# Patient Record
Sex: Male | Born: 1985 | Race: Black or African American | Hispanic: No | Marital: Married | Smoking: Current some day smoker
Health system: Southern US, Community
[De-identification: ages and names within clinical notes are randomized; demographics above are authoritative.]

## PROBLEM LIST (undated history)

## (undated) DIAGNOSIS — M419 Scoliosis, unspecified: Secondary | ICD-10-CM

---

## 2005-01-14 ENCOUNTER — Emergency Department (HOSPITAL_COMMUNITY): Admission: EM | Admit: 2005-01-14 | Discharge: 2005-01-14 | Payer: Self-pay | Admitting: Emergency Medicine

## 2009-08-31 ENCOUNTER — Ambulatory Visit: Payer: Self-pay | Admitting: Family Medicine

## 2010-11-26 ENCOUNTER — Encounter: Payer: Self-pay | Admitting: Family Medicine

## 2010-12-05 ENCOUNTER — Ambulatory Visit (INDEPENDENT_AMBULATORY_CARE_PROVIDER_SITE_OTHER): Payer: BC Managed Care – PPO | Admitting: Family Medicine

## 2010-12-05 ENCOUNTER — Encounter: Payer: Self-pay | Admitting: Family Medicine

## 2010-12-05 VITALS — BP 110/66 | HR 50 | Wt 140.0 lb

## 2010-12-05 DIAGNOSIS — L819 Disorder of pigmentation, unspecified: Secondary | ICD-10-CM

## 2010-12-05 DIAGNOSIS — B070 Plantar wart: Secondary | ICD-10-CM

## 2010-12-05 DIAGNOSIS — L818 Other specified disorders of pigmentation: Secondary | ICD-10-CM

## 2010-12-05 DIAGNOSIS — Z209 Contact with and (suspected) exposure to unspecified communicable disease: Secondary | ICD-10-CM

## 2010-12-05 DIAGNOSIS — Z0000 Encounter for general adult medical examination without abnormal findings: Secondary | ICD-10-CM

## 2010-12-05 LAB — POCT URINALYSIS DIPSTICK
Bilirubin, UA: NEGATIVE
Blood, UA: NEGATIVE
Ketones, UA: NEGATIVE
Leukocytes, UA: NEGATIVE
pH, UA: 6

## 2010-12-05 NOTE — Patient Instructions (Signed)
Use Compound W daily for 4 days and then scrape the area. Start over on a weekly basis.

## 2010-12-05 NOTE — Progress Notes (Signed)
  Subjective:    Patient ID: Eduardo Fields, male    DOB: 05/07/1986, 25 y.o.   MRN: [id number]  HPI he is here for a complete examination. He is now going to [employer] and wants to eventually go to PT school. He is dating a young lady and has been doing this for 11 years. They seem to be getting along fairly well although he does have concerns over her ultimate goals. He would like to be tested for STDs. He also needs a PPD for school. He did have a tattoo placed last year. He has no other concerns or complaints.    Review of Systems    negative Objective:   Physical ExamBP 110/66  Pulse 50  Wt 140 lb (63.504 kg)  General Appearance:    Alert, cooperative, no distress, appears stated age  Head:    Normocephalic, without obvious abnormality, atraumatic  Eyes:    PERRL, conjunctiva/corneas clear, EOM's intact, fundi    benign  Ears:    Normal TM's and external ear canals  Nose:   Nares normal, mucosa normal, no drainage or sinus   tenderness  Throat:   Lips, mucosa, and tongue normal; teeth and gums normal  Neck:   Supple, no lymphadenopathy;  thyroid:  no   enlargement/tenderness/nodules; no carotid   bruit or JVD  Back:    Spine nontender, no curvature, ROM normal, no CVA     tenderness  Lungs:     Clear to auscultation bilaterally without wheezes, rales or     ronchi; respirations unlabored  Chest Wall:    No tenderness or deformity   Heart:    Regular rate and rhythm, S1 and S2 normal, no murmur, rub   or gallop  Breast Exam:    No chest wall tenderness, masses or gynecomastia  Abdomen:     Soft, non-tender, nondistended, normoactive bowel sounds,    no masses, no hepatosplenomegaly  Genitalia:    Normal male external genitalia without lesions.  Testicles without masses.  No inguinal hernias.  Rectal:   Deferred due to age <40 and lack of symptoms  Extremities:   No clubbing, cyanosis or edema  Pulses:   2+ and symmetric all extremities  Skin:   Skin color, texture, turgor normal,  no rashes or lesions.tattoos present on anterior chest .plantar wart present at the base of the left fifth toe near the MTP joint   Lymph nodes:   Cervical, supraclavicular, and axillary nodes normal  Neurologic:   CNII-XII intact, normal strength, sensation and gait; reflexes 2+ and symmetric throughout          Psych:   Normal mood, affect, hygiene and grooming.            Assessment & Plan:  Tattoos. Plantar wart.

## 2010-12-06 ENCOUNTER — Telehealth: Payer: Self-pay

## 2010-12-06 LAB — HIV ANTIBODY (ROUTINE TESTING W REFLEX): HIV: NONREACTIVE

## 2010-12-06 LAB — RPR

## 2010-12-06 NOTE — Telephone Encounter (Signed)
Pt has been informed of lab results cmr

## 2010-12-06 NOTE — Telephone Encounter (Signed)
Left mess. To call me back labs are normal.cmr

## 2019-09-24 ENCOUNTER — Emergency Department (HOSPITAL_COMMUNITY): Payer: Self-pay

## 2019-09-24 ENCOUNTER — Emergency Department (HOSPITAL_COMMUNITY)
Admission: EM | Admit: 2019-09-24 | Discharge: 2019-09-24 | Disposition: A | Discharge status: Home / Self Care | Payer: Self-pay | Attending: Emergency Medicine | Admitting: Emergency Medicine

## 2019-09-24 ENCOUNTER — Other Ambulatory Visit: Payer: Self-pay

## 2019-09-24 ENCOUNTER — Encounter (HOSPITAL_COMMUNITY): Payer: Self-pay | Admitting: Emergency Medicine

## 2019-09-24 DIAGNOSIS — R112 Nausea with vomiting, unspecified: Secondary | ICD-10-CM | POA: Insufficient documentation

## 2019-09-24 DIAGNOSIS — N50812 Left testicular pain: Secondary | ICD-10-CM | POA: Insufficient documentation

## 2019-09-24 DIAGNOSIS — F17210 Nicotine dependence, cigarettes, uncomplicated: Secondary | ICD-10-CM | POA: Insufficient documentation

## 2019-09-24 LAB — COMPREHENSIVE METABOLIC PANEL
ALT: 14 U/L (ref 0–44)
AST: 18 U/L (ref 15–41)
Albumin: 4.1 g/dL (ref 3.5–5.0)
Alkaline Phosphatase: 52 U/L (ref 38–126)
Anion gap: 9 (ref 5–15)
BUN: 11 mg/dL (ref 6–20)
CO2: 27 mmol/L (ref 22–32)
Calcium: 9.3 mg/dL (ref 8.9–10.3)
Chloride: 103 mmol/L (ref 98–111)
Creatinine, Ser: 1.35 mg/dL — ABNORMAL HIGH (ref 0.61–1.24)
GFR calc Af Amer: >60 mL/min (ref >60)
GFR calc non Af Amer: >60 mL/min (ref >60)
Glucose, Bld: 105 mg/dL — ABNORMAL HIGH (ref 70–99)
Potassium: 3.9 mmol/L (ref 3.5–5.1)
Sodium: 139 mmol/L (ref 135–145)
Total Bilirubin: 0.3 mg/dL (ref 0.3–1.2)
Total Protein: 6.8 g/dL (ref 6.5–8.1)

## 2019-09-24 LAB — URINALYSIS, ROUTINE W REFLEX MICROSCOPIC
Bilirubin Urine: NEGATIVE
Glucose, UA: NEGATIVE mg/dL
Hgb urine dipstick: NEGATIVE
Ketones, ur: 20 mg/dL — AB
Leukocytes,Ua: NEGATIVE
Nitrite: NEGATIVE
Protein, ur: NEGATIVE mg/dL
Specific Gravity, Urine: 1.03 (ref 1.005–1.030)
pH: 5 (ref 5.0–8.0)

## 2019-09-24 LAB — CBC WITH DIFFERENTIAL/PLATELET
Abs Immature Granulocytes: 0.02 10*3/uL (ref 0.00–0.07)
Basophils Absolute: 0 10*3/uL (ref 0.0–0.1)
Basophils Relative: 0 %
Eosinophils Absolute: 0.1 10*3/uL (ref 0.0–0.5)
Eosinophils Relative: 2 %
HCT: 48.3 % (ref 39.0–52.0)
Hemoglobin: 16.1 g/dL (ref 13.0–17.0)
Immature Granulocytes: 0 %
Lymphocytes Relative: 30 %
Lymphs Abs: 2 10*3/uL (ref 0.7–4.0)
MCH: 32.5 pg (ref 26.0–34.0)
MCHC: 33.3 g/dL (ref 30.0–36.0)
MCV: 97.4 fL (ref 80.0–100.0)
Monocytes Absolute: 0.4 10*3/uL (ref 0.1–1.0)
Monocytes Relative: 6 %
Neutro Abs: 4.2 10*3/uL (ref 1.7–7.7)
Neutrophils Relative %: 62 %
Platelets: 275 10*3/uL (ref 150–400)
RBC: 4.96 MIL/uL (ref 4.22–5.81)
RDW: 11.8 % (ref 11.5–15.5)
WBC: 6.8 10*3/uL (ref 4.0–10.5)
nRBC: 0 % (ref 0.0–0.2)

## 2019-09-24 LAB — LIPASE, BLOOD: Lipase: 26 U/L (ref 11–51)

## 2019-09-24 MED ORDER — SODIUM CHLORIDE 0.9 % IV BOLUS
1000.0000 mL | Freq: Once | INTRAVENOUS | Status: AC
  Administered 2019-09-24: 11:00:00 1000 mL via INTRAVENOUS

## 2019-09-24 MED ORDER — ONDANSETRON 4 MG PO TBDP: ORAL_TABLET | ORAL | 0 refills | Status: DC

## 2019-09-24 MED ORDER — FAMOTIDINE 20 MG PO TABS: 20.0000 mg | ORAL_TABLET | Freq: Two times a day (BID) | ORAL | 0 refills | Status: DC

## 2019-09-24 MED ORDER — FAMOTIDINE IN NACL 20-0.9 MG/50ML-% IV SOLN
20.0000 mg | Freq: Once | INTRAVENOUS | Status: AC
  Administered 2019-09-24: 20 mg via INTRAVENOUS
  Filled 2019-09-24: qty 50

## 2019-09-24 NOTE — ED Provider Notes (Signed)
[facility] EMERGENCY DEPARTMENT Provider Note   CSN: [id number] Arrival date & time: 09/24/19  9924     History Chief Complaint  Patient presents with  . Abdominal Pain    Eduardo Messler. is a 34 y.o. male.  Eduardo Kimmel. is a 34 y.o. male who is otherwise healthy, presents for evaluation of abdominal pain. Patient reports with dull left abdominal pain radiating into the left groin and testicle. He reports pain is constant in the LLQ and groin but pain comes and goes in the testicle, is worse with standing for long periods of time. Patient is not sure if he has had any testicular swelling. Pain improves if he puts pressure on the testicle. Nausea  improved after taking TUMS. Reports last BM was this AM but he feels constipated, no diarrhea. as had 4 episodes of emesis over the last week. He is often nauseated after meals.  No dysuria or urinary frequency, no penile discharge, no flank pain.  No penile discharge.  No fevers or chills.  No chest pain, shortness of breath or cough.  The history is provided by the patient.       No past medical history on file.  There are no problems to display for this patient.   No past surgical history on file.     Family History  Problem Relation Age of Onset  . Diabetes Father     Social History   Tobacco Use  . Smoking status: Current Some Day Smoker    Packs/day: 0.10    Types: Cigars  . Smokeless tobacco: Never Used  Substance Use Topics  . Alcohol use: Yes    Alcohol/week: 1.0 standard drinks    Types: 1 Cans of beer per week  . Drug use: Yes    Types: Marijuana    Home Medications Prior to Admission medications   Medication Sig Start Date End Date Taking? Authorizing Provider  famotidine (PEPCID) 20 MG tablet Take 1 tablet (20 mg total) by mouth 2 (two) times daily. 09/24/19   Dartha Lodge, PA-C  ondansetron (ZOFRAN ODT) 4 MG disintegrating tablet 4mg  ODT q4 hours prn nausea/vomit 09/24/19    09/26/19, PA-C    Allergies    Patient has no known allergies.  Review of Systems   Review of Systems  Constitutional: Negative for chills and fever.  HENT: Negative.   Respiratory: Negative for cough and shortness of breath.   Cardiovascular: Negative for chest pain.  Gastrointestinal: Positive for abdominal pain, constipation, nausea and vomiting. Negative for blood in stool.  Genitourinary: Positive for testicular pain. Negative for discharge, dysuria, flank pain, frequency, hematuria, penile pain and scrotal swelling.  Musculoskeletal: Negative for arthralgias, back pain and myalgias.  Skin: Negative for color change and rash.  Neurological: Negative for dizziness and light-headedness.    Physical Exam Updated Vital Signs BP (!) 134/94 (BP Location: Left Arm)   Pulse 65   Temp 98.9 F (37.2 C) (Oral)   Resp 17   Ht 6\' 1"  (1.854 m)   Wt 63.5 kg   SpO2 100%   BMI 18.47 kg/m   Physical Exam Vitals and nursing note reviewed.  Constitutional:      General: He is not in acute distress.    Appearance: He is well-developed and normal weight. He is not diaphoretic.  HENT:     Head: Normocephalic and atraumatic.  Eyes:     General:        Right  eye: No discharge.        Left eye: No discharge.     Pupils: Pupils are equal, round, and reactive to light.  Cardiovascular:     Rate and Rhythm: Normal rate and regular rhythm.     Heart sounds: Normal heart sounds.  Pulmonary:     Effort: Pulmonary effort is normal. No respiratory distress.     Breath sounds: Normal breath sounds. No wheezing or rales.     Comments: Respirations equal and unlabored, patient able to speak in full sentences, lungs clear to auscultation bilaterally Abdominal:     General: Bowel sounds are normal. There is no distension.     Palpations: Abdomen is soft. There is no mass.     Tenderness: There is no abdominal tenderness. There is no guarding.     Comments: Abdomen soft, nondistended,  nontender to palpation in all quadrants without guarding or peritoneal signs  Genitourinary:    Testes:        Right: Mass, tenderness or swelling not present.        Left: Tenderness present. Mass or swelling not present.     Comments: Chaperone present during genital exam. Penis normal, no discharge noted. There is some mild tenderness over the left testicle, no swelling or palpable masses.  Right testicle is unremarkable.  No erythema or skin changes over the scrotum. Musculoskeletal:        General: No deformity.     Cervical back: Neck supple.  Skin:    General: Skin is warm and dry.     Capillary Refill: Capillary refill takes less than 2 seconds.  Neurological:     Mental Status: He is alert.     Coordination: Coordination normal.     Comments: Speech is clear, able to follow commands Moves extremities without ataxia, coordination intact  Psychiatric:        Mood and Affect: Mood normal.        Behavior: Behavior normal.     ED Results / Procedures / Treatments   Labs (all labs ordered are listed, but only abnormal results are displayed) Labs Reviewed  COMPREHENSIVE METABOLIC PANEL - Abnormal; Notable for the following components:      Result Value   Glucose, Bld 105 (*)    Creatinine, Ser 1.35 (*)    All other components within normal limits  URINALYSIS, ROUTINE W REFLEX MICROSCOPIC - Abnormal; Notable for the following components:   Ketones, ur 20 (*)    All other components within normal limits  LIPASE, BLOOD  CBC WITH DIFFERENTIAL/PLATELET  GC/CHLAMYDIA PROBE AMP ([place]) NOT AT [facility]    EKG None  Radiology US SCROTUM W/DOPPLER  Result Date: 09/24/2019 CLINICAL DATA:  Left testicular pain for 1 week EXAM: SCROTAL ULTRASOUND DOPPLER ULTRASOUND OF THE TESTICLES TECHNIQUE: Complete ultrasound examination of the testicles, epididymis, and other scrotal structures was performed. Color and spectral Doppler ultrasound were also utilized to evaluate blood flow  to the testicles. COMPARISON:  None. FINDINGS: Right testicle Measurements: 4.2 x 2.0 x 2.6 cm. No mass or microlithiasis visualized. Left testicle Measurements: 4.3 x 1.9 x 2.6 cm. No mass or microlithiasis visualized. Right epididymis:  Normal in size and appearance. Left epididymis:  Normal in size and appearance. Hydrocele:  None visualized. Varicocele:  None visualized. Pulsed Doppler interrogation of both testes demonstrates normal low resistance arterial and venous waveforms bilaterally. IMPRESSION: Unremarkable scrotal ultrasound. No evidence of testicular torsion or intratesticular mass. Electronically Signed   By: Hart Carwin  Plundo D.O.   On: 09/24/2019 11:28    Procedures Procedures (including critical care time)  Medications Ordered in ED Medications  sodium chloride 0.9 % bolus 1,000 mL (0 mLs Intravenous Stopped 09/24/19 1354)  famotidine (PEPCID) IVPB 20 mg premix (0 mg Intravenous Stopped 09/24/19 1130)    ED Course  I have reviewed the triage vital signs and the nursing notes.  Pertinent labs & imaging results that were available during my care of the patient were reviewed by me and considered in my medical decision making (see chart for details).    MDM Rules/Calculators/A&P                      34 year old male presents with left testicular pain, also reported some intermittent left lower quadrant abdominal pain.  On arrival patient is well-appearing.  He has no tenderness at all on abdominal exam today which is reassuring.  He does have some mild left testicular tenderness, no swelling or masses noted.  He also reports some intermittent nausea and emesis which is worse after meals.  Will check abdominal labs and testicular ultrasound.  We will also send urine GC chlamydia.  Patient's lab work is overall very reassuring he has no leukocytosis and normal hemoglobin, glucose of 105 but no other electrolyte derangements, creatinine of 1.35, no previous available for comparison,  this may be patient's baseline, patient given IV fluids and asked to follow-up with his PCP regarding this, normal LFTs and normal lipase.  Urinalysis with no signs of infection, no hematuria.  He has no flank pain associated with groin and testicular pain, I have low suspicion for renal stone.  On exam patient does not have any palpable inguinal hernia.  Ultrasound of the scrotum is unremarkable with no evidence of torsion, epididymitis, testicular mass.  Will have patient follow-up with urology regarding testicular pain, he is in no distress, has not had any emesis here.  Will have patient use Pepcid and Zofran at home.  Suspect emesis may be related to reflux since it is worse after meals.  Return precautions discussed.  Patient expresses understanding and agreement with plan.  Discharged home in good condition.  Final Clinical Impression(s) / ED Diagnoses Final diagnoses:  Pain in left testicle  Non-intractable vomiting with nausea, unspecified vomiting type    Rx / DC Orders ED Discharge Orders         Ordered    ondansetron (ZOFRAN ODT) 4 MG disintegrating tablet     09/24/19 1332    famotidine (PEPCID) 20 MG tablet  2 times daily     09/24/19 1332           Dartha Lodge, [place] 09/24/19 1924    Jacalyn Lefevre, MD 09/30/19 1203

## 2019-09-24 NOTE — Discharge Instructions (Signed)
Your work-up today has been reassuring.  Your ultrasound does not show a specific cause for your testicular pain, your lab work overall looks good.  Your kidney function was slightly elevated today, please make sure you're drinking plenty of water and follow-up with Dr. Susann Givens regarding this.  If you continue to have testicular pain please follow-up with urology.  To help with your nausea and vomiting please take Pepcid twice daily before breakfast and dinner, use Zofran as needed for nausea and vomiting.  You can take Tylenol to help with your testicular pain.  Return to the ED for any new or concerning symptoms.

## 2019-09-24 NOTE — ED Notes (Signed)
Patient verbalizes understanding of discharge instructions . Opportunity for questions and answers were provided . Armband removed by staff ,Pt discharged from ED. W/C  offered at D/C  and Declined W/C at D/C and was escorted to lobby by RN.  

## 2019-09-24 NOTE — ED Triage Notes (Signed)
C/o vomiting started this week, now has left lower abd pain and left groin pain.

## 2019-09-24 NOTE — ED Notes (Signed)
Pt. Transported to [place]

## 2019-09-25 LAB — GC/CHLAMYDIA PROBE AMP (~~LOC~~) NOT AT ARMC
Chlamydia: NEGATIVE
Neisseria Gonorrhea: NEGATIVE

## 2019-10-21 ENCOUNTER — Encounter (HOSPITAL_COMMUNITY): Payer: Self-pay | Admitting: Emergency Medicine

## 2019-10-21 ENCOUNTER — Emergency Department (HOSPITAL_COMMUNITY)
Admission: EM | Admit: 2019-10-21 | Discharge: 2019-10-22 | Disposition: A | Discharge status: Home / Self Care | Payer: PRIVATE HEALTH INSURANCE | Attending: Emergency Medicine | Admitting: Emergency Medicine

## 2019-10-21 ENCOUNTER — Other Ambulatory Visit: Payer: Self-pay

## 2019-10-21 ENCOUNTER — Emergency Department (HOSPITAL_COMMUNITY): Payer: PRIVATE HEALTH INSURANCE

## 2019-10-21 DIAGNOSIS — R1013 Epigastric pain: Secondary | ICD-10-CM | POA: Diagnosis not present

## 2019-10-21 DIAGNOSIS — K2900 Acute gastritis without bleeding: Secondary | ICD-10-CM | POA: Diagnosis not present

## 2019-10-21 DIAGNOSIS — F1210 Cannabis abuse, uncomplicated: Secondary | ICD-10-CM | POA: Diagnosis not present

## 2019-10-21 DIAGNOSIS — R112 Nausea with vomiting, unspecified: Secondary | ICD-10-CM | POA: Diagnosis present

## 2019-10-21 DIAGNOSIS — F17290 Nicotine dependence, other tobacco product, uncomplicated: Secondary | ICD-10-CM | POA: Diagnosis not present

## 2019-10-21 HISTORY — DX: Scoliosis, unspecified: M41.9

## 2019-10-21 LAB — URINALYSIS, ROUTINE W REFLEX MICROSCOPIC
Bacteria, UA: NONE SEEN
Bilirubin Urine: NEGATIVE
Glucose, UA: NEGATIVE mg/dL
Hgb urine dipstick: NEGATIVE
Ketones, ur: 20 mg/dL — AB
Leukocytes,Ua: NEGATIVE
Nitrite: NEGATIVE
Protein, ur: 30 mg/dL — AB
Specific Gravity, Urine: >1.046 — ABNORMAL HIGH (ref 1.005–1.030)
pH: 6 (ref 5.0–8.0)

## 2019-10-21 LAB — LIPASE, BLOOD: Lipase: 49 U/L (ref 11–51)

## 2019-10-21 LAB — CBC
HCT: 45.3 % (ref 39.0–52.0)
Hemoglobin: 15.1 g/dL (ref 13.0–17.0)
MCH: 32.5 pg (ref 26.0–34.0)
MCHC: 33.3 g/dL (ref 30.0–36.0)
MCV: 97.6 fL (ref 80.0–100.0)
Platelets: 237 10*3/uL (ref 150–400)
RBC: 4.64 MIL/uL (ref 4.22–5.81)
RDW: 11.9 % (ref 11.5–15.5)
WBC: 7 10*3/uL (ref 4.0–10.5)
nRBC: 0 % (ref 0.0–0.2)

## 2019-10-21 LAB — COMPREHENSIVE METABOLIC PANEL
ALT: 19 U/L (ref 0–44)
AST: 18 U/L (ref 15–41)
Albumin: 4.7 g/dL (ref 3.5–5.0)
Alkaline Phosphatase: 52 U/L (ref 38–126)
Anion gap: 8 (ref 5–15)
BUN: 12 mg/dL (ref 6–20)
CO2: 28 mmol/L (ref 22–32)
Calcium: 9.5 mg/dL (ref 8.9–10.3)
Chloride: 105 mmol/L (ref 98–111)
Creatinine, Ser: 1.26 mg/dL — ABNORMAL HIGH (ref 0.61–1.24)
GFR calc Af Amer: >60 mL/min (ref >60)
GFR calc non Af Amer: >60 mL/min (ref >60)
Glucose, Bld: 98 mg/dL (ref 70–99)
Potassium: 4.3 mmol/L (ref 3.5–5.1)
Sodium: 141 mmol/L (ref 135–145)
Total Bilirubin: 1.1 mg/dL (ref 0.3–1.2)
Total Protein: 7.4 g/dL (ref 6.5–8.1)

## 2019-10-21 MED ORDER — ONDANSETRON HCL 4 MG/2ML IJ SOLN
4.0000 mg | Freq: Once | INTRAMUSCULAR | Status: AC
  Administered 2019-10-21: 20:00:00 4 mg via INTRAVENOUS
  Filled 2019-10-21: qty 2

## 2019-10-21 MED ORDER — PROMETHAZINE HCL 25 MG PO TABS: 25.0000 mg | ORAL_TABLET | Freq: Four times a day (QID) | ORAL | 0 refills | Status: DC | PRN

## 2019-10-21 MED ORDER — SODIUM CHLORIDE 0.9 % IV BOLUS
2000.0000 mL | Freq: Once | INTRAVENOUS | Status: AC
  Administered 2019-10-21: 20:00:00 2000 mL via INTRAVENOUS

## 2019-10-21 MED ORDER — SUCRALFATE 1 GM/10ML PO SUSP: 1.0000 g | Freq: Three times a day (TID) | ORAL | 0 refills | Status: DC

## 2019-10-21 MED ORDER — ESOMEPRAZOLE MAGNESIUM 40 MG PO CPDR: 40.0000 mg | DELAYED_RELEASE_CAPSULE | Freq: Every day | ORAL | 0 refills | Status: DC

## 2019-10-21 MED ORDER — IOHEXOL 300 MG/ML  SOLN
100.0000 mL | Freq: Once | INTRAMUSCULAR | Status: AC | PRN
  Administered 2019-10-21: 80 mL via INTRAVENOUS

## 2019-10-21 MED ORDER — SODIUM CHLORIDE 0.9 % IV BOLUS
1000.0000 mL | Freq: Once | INTRAVENOUS | Status: AC
  Administered 2019-10-21: 1000 mL via INTRAVENOUS

## 2019-10-21 NOTE — ED Triage Notes (Signed)
Pt c/o abd pains with nausea for a month. Denies urinary or bowel problems.

## 2019-10-21 NOTE — Discharge Instructions (Addendum)
Return here as needed.  Follow-up with GI for further evaluation and care.

## 2019-10-21 NOTE — ED Provider Notes (Signed)
[facility]-EMERGENCY DEPT Provider Note   CSN: [id number] Arrival date & time: 10/21/19  1528     History Chief Complaint  Patient presents with  . Nausea  . Abdominal Pain    Eduardo Hernan. is a 34 y.o. male.  HPI Patient presents to the emergency department with nausea vomiting and abdominal discomfort that started over a month ago.  The patient states that he is having the mid epigastrium.  The patient states the pain does not seem to radiate anywhere.  The patient states he did not take any new medications prior to arrival for the symptoms.  Patient states he was seen in the emergency department several weeks ago and has not improved since that visit.  Patient states that nothing seems to make the condition better but eating makes condition worse.  The patient denies chest pain, shortness of breath, headache,blurred vision, neck pain, fever, cough, weakness, numbness, dizziness, anorexia, edema,  diarrhea, rash, back pain, dysuria, hematemesis, bloody stool, near syncope, or syncope.    Past Medical History:  Diagnosis Date  . Scoliosis     There are no problems to display for this patient.   History reviewed. No pertinent surgical history.     Family History  Problem Relation Age of Onset  . Diabetes Father     Social History   Tobacco Use  . Smoking status: Current Some Day Smoker    Packs/day: 0.10    Types: Cigars  . Smokeless tobacco: Never Used  Substance Use Topics  . Alcohol use: Yes    Alcohol/week: 1.0 standard drinks    Types: 1 Cans of beer per week  . Drug use: Yes    Types: Marijuana    Home Medications Prior to Admission medications   Medication Sig Start Date End Date Taking? Authorizing Provider  famotidine (PEPCID) 20 MG tablet Take 1 tablet (20 mg total) by mouth 2 (two) times daily. Patient taking differently: Take 20 mg by mouth 2 (two) times daily as needed for heartburn.  09/24/19  Yes Dartha Lodge, PA-C    esomeprazole (NEXIUM) 40 MG capsule Take 1 capsule (40 mg total) by mouth daily. 10/21/19   Alga Southall, Cristal Deer, PA-C  ondansetron (ZOFRAN ODT) 4 MG disintegrating tablet 4mg  ODT q4 hours prn nausea/vomit Patient not taking: Reported on 10/21/2019 09/24/19   09/26/19, PA-C  promethazine (PHENERGAN) 25 MG tablet Take 1 tablet (25 mg total) by mouth every 6 (six) hours as needed for nausea or vomiting. 10/21/19   Omarii Scalzo, 10/23/19, PA-C  sucralfate (CARAFATE) 1 GM/10ML suspension Take 10 mLs (1 g total) by mouth 4 (four) times daily -  with meals and at bedtime. 10/21/19   10/23/19, PA-C    Allergies    Patient has no known allergies.  Review of Systems   Review of Systems All other systems negative except as documented in the HPI. All pertinent positives and negatives as reviewed in the HPI. Physical Exam Updated Vital Signs BP 119/80   Pulse (!) 55   Temp 99 F (37.2 C)   Resp 15   Ht 6\' 1"  (1.854 m)   Wt 59.5 kg   SpO2 100%   BMI 17.31 kg/m   Physical Exam Vitals and nursing note reviewed.  Constitutional:      General: He is not in acute distress.    Appearance: He is well-developed.  HENT:     Head: Normocephalic and atraumatic.  Eyes:     Pupils:  Pupils are equal, round, and reactive to light.  Cardiovascular:     Rate and Rhythm: Normal rate and regular rhythm.     Heart sounds: Normal heart sounds. No murmur. No friction rub. No gallop.   Pulmonary:     Effort: Pulmonary effort is normal. No respiratory distress.     Breath sounds: Normal breath sounds. No wheezing.  Abdominal:     General: Bowel sounds are normal. There is no distension.     Palpations: Abdomen is soft.     Tenderness: There is abdominal tenderness in the right upper quadrant, epigastric area and periumbilical area.  Musculoskeletal:     Cervical back: Normal range of motion and neck supple.  Skin:    General: Skin is warm and dry.     Capillary Refill: Capillary refill takes  less than 2 seconds.     Findings: No erythema or rash.  Neurological:     Mental Status: He is alert and oriented to person, place, and time.     Motor: No abnormal muscle tone.     Coordination: Coordination normal.  Psychiatric:        Behavior: Behavior normal.     ED Results / Procedures / Treatments   Labs (all labs ordered are listed, but only abnormal results are displayed) Labs Reviewed  COMPREHENSIVE METABOLIC PANEL - Abnormal; Notable for the following components:      Result Value   Creatinine, Ser 1.26 (*)    All other components within normal limits  URINALYSIS, ROUTINE W REFLEX MICROSCOPIC - Abnormal; Notable for the following components:   Specific Gravity, Urine >1.046 (*)    Ketones, ur 20 (*)    Protein, ur 30 (*)    All other components within normal limits  LIPASE, BLOOD  CBC    EKG None  Radiology CT Abdomen Pelvis W Contrast  Result Date: 10/21/2019 CLINICAL DATA:  34 year old male with nausea abdominal pain. EXAM: CT ABDOMEN AND PELVIS WITH CONTRAST TECHNIQUE: Multidetector CT imaging of the abdomen and pelvis was performed using the standard protocol following bolus administration of intravenous contrast. CONTRAST:  36mL OMNIPAQUE IOHEXOL 300 MG/ML  SOLN COMPARISON:  None. FINDINGS: Evaluation is limited to severe cachexia and anasarca. Lower chest: The visualized lung bases are clear. No intra-abdominal free air. Small free fluid may be present within the pelvis. Hepatobiliary: No focal liver abnormality is seen. No gallstones, gallbladder wall thickening, or biliary dilatation. Pancreas: Unremarkable. No pancreatic ductal dilatation or surrounding inflammatory changes. Spleen: Normal in size without focal abnormality. Adrenals/Urinary Tract: Adrenal glands are unremarkable. Kidneys are normal, without renal calculi, focal lesion, or hydronephrosis. Bladder is unremarkable. Stomach/Bowel: There is no bowel obstruction. Mild thickened appearance of the  gastric rugal folds. Clinical correlation is recommended to evaluate for gastritis. Mild diffuse thickened appearance of the colonic wall may be related to underdistention or represent colitis. The appendix is normal as visualized. Vascular/Lymphatic: The abdominal aorta and IVC are grossly unremarkable. No portal venous gas. No adenopathy. Reproductive: The prostate and seminal vesicles are suboptimally visualized. Other: Loss of subcutaneous and mesenteric fat. Musculoskeletal: No acute or significant osseous findings. IMPRESSION: 1. Mild thickened appearance of the gastric rugal folds as well as apparent mild diffuse thickening of the colon. Clinical correlation is recommended to evaluate for possibility of gastritis or colitis. No bowel obstruction. Normal appendix. 2. Loss of subcutaneous and mesenteric fat and cachexia. Electronically Signed   By: Anner Crete M.D.   On: 10/21/2019 20:50  Procedures Procedures (including critical care time)  Medications Ordered in ED Medications  sodium chloride 0.9 % bolus 2,000 mL (0 mLs Intravenous Stopped 10/21/19 2200)  ondansetron (ZOFRAN) injection 4 mg (4 mg Intravenous Given 10/21/19 1952)  iohexol (OMNIPAQUE) 300 MG/ML solution 100 mL (80 mLs Intravenous Contrast Given 10/21/19 2028)  sodium chloride 0.9 % bolus 1,000 mL (1,000 mLs Intravenous New Bag/Given (Non-Interop) 10/21/19 2229)    ED Course  I have reviewed the triage vital signs and the nursing notes.  Pertinent labs & imaging results that were available during my care of the patient were reviewed by me and considered in my medical decision making (see chart for details).    MDM Rules/Calculators/A&P                      Patient has epigastric and periumbilical pain mainly.  The patient has some findings on the CT scan that show possible concern for gastritis versus enteritis.  I feel that the gastritis is more likely due to the fact that eating makes condition worse.  Also because  of the location of the pain this is a concern.  I have given the patient GI follow-up and told to return here as needed.  Patient was given 3 L of IV fluids due to what I feel is significant dehydration.  We will treat with antiemetics Carafate and Nexium.  Told the patient to return here for any worsening in his condition.  Patient was able to tolerate ginger ale prior to discharge Final Clinical Impression(s) / ED Diagnoses Final diagnoses:  Acute superficial gastritis without hemorrhage    Rx / DC Orders ED Discharge Orders         Ordered    sucralfate (CARAFATE) 1 GM/10ML suspension  3 times daily with meals & bedtime     10/21/19 2350    esomeprazole (NEXIUM) 40 MG capsule  Daily     10/21/19 2350    promethazine (PHENERGAN) 25 MG tablet  Every 6 hours PRN     10/21/19 2350           Charlestine Night, PA-C 10/23/19 2007    Bethann Berkshire, MD 10/25/19 [id number]

## 2019-10-21 NOTE — ED Notes (Signed)
Pt lying in bed. gingerale given. Full monitor on. Pt denies any other needs. Will continue to monitor pt.

## 2019-10-21 NOTE — ED Notes (Signed)
Pt lying in bed watching t.v. full monitor on. Pt denies any needs. Warm blanket given. Will continue to monitor.

## 2019-10-22 ENCOUNTER — Encounter: Payer: Self-pay | Admitting: Gastroenterology

## 2019-11-22 DIAGNOSIS — R109 Unspecified abdominal pain: Secondary | ICD-10-CM | POA: Insufficient documentation

## 2019-11-22 DIAGNOSIS — R112 Nausea with vomiting, unspecified: Secondary | ICD-10-CM | POA: Insufficient documentation

## 2019-11-23 ENCOUNTER — Ambulatory Visit (INDEPENDENT_AMBULATORY_CARE_PROVIDER_SITE_OTHER): Payer: PRIVATE HEALTH INSURANCE | Admitting: Gastroenterology

## 2019-11-23 ENCOUNTER — Encounter: Payer: Self-pay | Admitting: Gastroenterology

## 2019-11-23 VITALS — BP 124/68 | HR 67 | Temp 98.7°F | Ht 73.0 in | Wt 136.0 lb

## 2019-11-23 DIAGNOSIS — R112 Nausea with vomiting, unspecified: Secondary | ICD-10-CM | POA: Diagnosis not present

## 2019-11-23 NOTE — Progress Notes (Signed)
HPI: This is a very pleasant 34 year old man who was referred to me by Denita Lung, MD  to evaluate recent nausea, vomiting.    About a month ago he suffered from 1 to 2 weeks of nausea and vomiting on nearly a daily basis.  Eating would often bring it on.  He vomited several times throughout this 10-day episode.  He never saw blood in his vomit.  He had no bowel changes around this time.  Specifically no diarrhea.  No bloody diarrhea certainly.  He does not at all take NSAIDs.  He had no sick contacts.  He was having some intermittent left upper quadrant pains at that time.  He has never had issues like this before and shortly after he was evaluated in the ER and hydrated he felt better and he has not had any trouble since then.  He did not pick up any of the prescriptions that were given to him from the emergency room.  He has smoked marijuana daily, several times daily for many many years.  He is thin and has always been thin.  He did lose about 10 pounds throughout this episode but he tells me he is already putting it back on.  Old Data Reviewed:  Blood work March 2021 shows normal complete metabolic profile except for creatinine 1.26, normal CBC, normal lipase.  CT scan abdomen pelvis with IV and oral contrast March 2021 done for "abdominal pain, nausea" reported "mild thickened appearance of the gastric rugal folds as well as an apparent mild diffuse thickening of colon.  Clinical correlation is recommended to evaluate for possible gastritis or colitis.  No bowel obstruction.  Normal appendix."    Review of systems: Pertinent positive and negative review of systems were noted in the above HPI section. All other review negative.   Past Medical History:  Diagnosis Date  . Scoliosis     History reviewed. No pertinent surgical history.  No current outpatient medications on file.   No current facility-administered medications for this visit.    Allergies as of 11/23/2019   . (No Known Allergies)    Family History  Problem Relation Age of Onset  . Diabetes Father     Social History   Socioeconomic History  . Marital status: Married    Spouse name: Not on file  . Number of children: Not on file  . Years of education: Not on file  . Highest education level: Not on file  Occupational History  . Not on file  Tobacco Use  . Smoking status: Current Some Day Smoker    Packs/day: 0.10    Types: Cigars  . Smokeless tobacco: Never Used  Substance and Sexual Activity  . Alcohol use: Yes    Alcohol/week: 1.0 standard drinks    Types: 1 Cans of beer per week  . Drug use: Yes    Types: Marijuana  . Sexual activity: Yes  Other Topics Concern  . Not on file  Social History Narrative  . Not on file   Social Determinants of Health   Financial Resource Strain:   . Difficulty of Paying Living Expenses:   Food Insecurity:   . Worried About Charity fundraiser in the Last Year:   . Arboriculturist in the Last Year:   Transportation Needs:   . Film/video editor (Medical):   Marland Kitchen Lack of Transportation (Non-Medical):   Physical Activity:   . Days of Exercise per Week:   .  Minutes of Exercise per Session:   Stress:   . Feeling of Stress :   Social Connections:   . Frequency of Communication with Friends and Family:   . Frequency of Social Gatherings with Friends and Family:   . Attends Religious Services:   . Active Member of Clubs or Organizations:   . Attends Banker Meetings:   Marland Kitchen Marital Status:   Intimate Partner Violence:   . Fear of Current or Ex-Partner:   . Emotionally Abused:   Marland Kitchen Physically Abused:   . Sexually Abused:      Physical Exam: BP 124/68   Pulse 67   Temp 98.7 F (37.1 C)   Ht 6\' 1"  (1.854 m)   Wt 136 lb (61.7 kg)   BMI 17.94 kg/m  Constitutional: generally well-appearing Psychiatric: alert and oriented x3 Eyes: extraocular movements intact Mouth: oral pharynx moist, no lesions Neck: supple no  lymphadenopathy Cardiovascular: heart regular rate and rhythm Lungs: clear to auscultation bilaterally Abdomen: soft, nontender, nondistended, no obvious ascites, no peritoneal signs, normal bowel sounds Extremities: no lower extremity edema bilaterally Skin: no lesions on visible extremities   Assessment and plan: 34 y.o. male with nausea vomiting episode that has resolved  I explained to him that it is certainly possible that he had an infectious enteritis or gastroenteritis which has resolved on its own.  He really feels back to normal now and he is putting weight back on.  The CAT scan findings are not at all concerning for underlying neoplasm.  I did offer him further testing with colonoscopy and upper endoscopy however he declined at this point and preferred to wait to see how he does over time and if the event, episode happens again then he would agree.  I certainly think that is reasonable since he is feeling completely back to normal now.  We did discuss that possibly his chronic daily use of marijuana could contribute to nausea rather than treat nausea.    Please see the "Patient Instructions" section for addition details about the plan.   20, MD [facility] 11/23/2019, 2:44 PM  Cc: 11/25/2019, MD  Total time on date of encounter was 45  minutes (this included time spent preparing to see the patient reviewing records; obtaining and/or reviewing separately obtained history; performing a medically appropriate exam and/or evaluation; counseling and educating the patient and family if present; ordering medications, tests or procedures if applicable; and documenting clinical information in the health record).

## 2019-11-23 NOTE — Patient Instructions (Addendum)
If you are age 35 or older, your body mass index should be between 23-30. Your Body mass index is 17.94 kg/m. If this is out of the aforementioned range listed, please consider follow up with your Primary Care Provider.  If you are age 38 or younger, your body mass index should be between 19-25. Your Body mass index is 17.94 kg/m. If this is out of the aformentioned range listed, please consider follow up with your Primary Care Provider.   You will follow up with our office on an as needed basis or if symptoms worsen or fail to improve.  Thank you for entrusting me with your care and choosing [facility].  Dr Christella Hartigan

## 2020-12-28 ENCOUNTER — Emergency Department (HOSPITAL_COMMUNITY)
Admission: EM | Admit: 2020-12-28 | Discharge: 2020-12-28 | Disposition: A | Discharge status: Home / Self Care | Payer: PRIVATE HEALTH INSURANCE | Attending: Emergency Medicine | Admitting: Emergency Medicine

## 2020-12-28 ENCOUNTER — Encounter (HOSPITAL_COMMUNITY): Payer: Self-pay | Admitting: Emergency Medicine

## 2020-12-28 DIAGNOSIS — F17290 Nicotine dependence, other tobacco product, uncomplicated: Secondary | ICD-10-CM | POA: Diagnosis not present

## 2020-12-28 DIAGNOSIS — R109 Unspecified abdominal pain: Secondary | ICD-10-CM | POA: Insufficient documentation

## 2020-12-28 DIAGNOSIS — R5383 Other fatigue: Secondary | ICD-10-CM | POA: Diagnosis not present

## 2020-12-28 DIAGNOSIS — R748 Abnormal levels of other serum enzymes: Secondary | ICD-10-CM

## 2020-12-28 DIAGNOSIS — R432 Parageusia: Secondary | ICD-10-CM | POA: Insufficient documentation

## 2020-12-28 DIAGNOSIS — R1110 Vomiting, unspecified: Secondary | ICD-10-CM | POA: Insufficient documentation

## 2020-12-28 DIAGNOSIS — R5382 Chronic fatigue, unspecified: Secondary | ICD-10-CM

## 2020-12-28 LAB — URINALYSIS, ROUTINE W REFLEX MICROSCOPIC
Bilirubin Urine: NEGATIVE
Glucose, UA: NEGATIVE mg/dL
Hgb urine dipstick: NEGATIVE
Ketones, ur: 5 mg/dL — AB
Leukocytes,Ua: NEGATIVE
Nitrite: NEGATIVE
Protein, ur: NEGATIVE mg/dL
Specific Gravity, Urine: 1.034 — ABNORMAL HIGH (ref 1.005–1.030)
pH: 5 (ref 5.0–8.0)

## 2020-12-28 LAB — LIPASE, BLOOD: Lipase: 64 U/L — ABNORMAL HIGH (ref 11–51)

## 2020-12-28 LAB — CBC
HCT: 50 % (ref 39.0–52.0)
Hemoglobin: 16.7 g/dL (ref 13.0–17.0)
MCH: 32.3 pg (ref 26.0–34.0)
MCHC: 33.4 g/dL (ref 30.0–36.0)
MCV: 96.7 fL (ref 80.0–100.0)
Platelets: 283 10*3/uL (ref 150–400)
RBC: 5.17 MIL/uL (ref 4.22–5.81)
RDW: 11.9 % (ref 11.5–15.5)
WBC: 7.1 10*3/uL (ref 4.0–10.5)
nRBC: 0 % (ref 0.0–0.2)

## 2020-12-28 LAB — COMPREHENSIVE METABOLIC PANEL
ALT: 12 U/L (ref 0–44)
AST: 19 U/L (ref 15–41)
Albumin: 4.5 g/dL (ref 3.5–5.0)
Alkaline Phosphatase: 57 U/L (ref 38–126)
Anion gap: 8 (ref 5–15)
BUN: 16 mg/dL (ref 6–20)
CO2: 26 mmol/L (ref 22–32)
Calcium: 9.7 mg/dL (ref 8.9–10.3)
Chloride: 102 mmol/L (ref 98–111)
Creatinine, Ser: 1.45 mg/dL — ABNORMAL HIGH (ref 0.61–1.24)
GFR, Estimated: >60 mL/min (ref >60)
Glucose, Bld: 112 mg/dL — ABNORMAL HIGH (ref 70–99)
Potassium: 4.3 mmol/L (ref 3.5–5.1)
Sodium: 136 mmol/L (ref 135–145)
Total Bilirubin: 1.2 mg/dL (ref 0.3–1.2)
Total Protein: 7.3 g/dL (ref 6.5–8.1)

## 2020-12-28 NOTE — Discharge Instructions (Addendum)
You were seen today for fatigue and left flank pain. While we were unable to find a reason here in the ED today, we feel you are safe to discharge and follow up. [facility] to establish care with a PCP. Your Creatinine was elevated int he visit today and has been chronically. This should be evaluated further. If you develop fever, vomiting or things worsen please return to the ED for further evaluation.

## 2020-12-28 NOTE — ED Triage Notes (Addendum)
Patient complains of left sided flank pain, metallic taste in his mouth, fatigue for the last two years and then got worse one week ago. Patient states for the last three days he has been taking an antibiotic BID given to him by a friend but he does not recall the name of the drug. Patient alert, oriented, and in no apparent distress at this time.

## 2020-12-28 NOTE — ED Provider Notes (Signed)
[facility] EMERGENCY DEPARTMENT Provider Note   CSN: [id number] Arrival date & time: 12/28/20  2376     History Chief Complaint  Patient presents with  . Abdominal Pain    Eduardo Fields. is a 35 y.o. male.  HPI   Patient presents with left sided flank pain, metallic taste in mouth, and fatigue x 2 years. Worsened one week ago. The flank pain is constant, sharp and not alleviated by anything. No dysuria, hematuria. He reports one episode of emesis last week. He denies nausea. No fevers, some chills.   No history of kidney stones. Rarely drinks, one beer every few months. No IV drug use. No unprotected sex. No IV drug use. Smokes marijuana occasionally.   He reports fatgiue x 2 years. States this also worsened a weak ago. HE feels tired walking, and throughout the day.  Past Medical History:  Diagnosis Date  . Scoliosis     Patient Active Problem List   Diagnosis Date Noted  . Nausea and vomiting 11/22/2019  . Abdominal discomfort 11/22/2019    History reviewed. No pertinent surgical history.     Family History  Problem Relation Age of Onset  . Diabetes Father     Social History   Tobacco Use  . Smoking status: Current Some Day Smoker    Packs/day: 0.10    Types: Cigars  . Smokeless tobacco: Never Used  Substance Use Topics  . Alcohol use: Yes    Alcohol/week: 1.0 standard drink    Types: 1 Cans of beer per week  . Drug use: Yes    Types: Marijuana    Home Medications Prior to Admission medications   Not on File    Allergies    Patient has no known allergies.  Review of Systems   Review of Systems  Constitutional: Positive for chills and fatigue. Negative for fever.  HENT: Negative for ear pain and sore throat.   Eyes: Negative for pain and visual disturbance.  Respiratory: Negative for cough, choking and shortness of breath.   Cardiovascular: Negative for chest pain and palpitations.  Gastrointestinal: Positive for  abdominal pain and vomiting. Negative for nausea.  Genitourinary: Negative for dysuria and hematuria.  Musculoskeletal: Negative for arthralgias and back pain.  Skin: Negative for color change and rash.  Neurological: Negative for seizures, syncope, weakness and headaches.  All other systems reviewed and are negative.   Physical Exam Updated Vital Signs BP (!) 121/94 (BP Location: Left Arm)   Pulse (!) 55   Temp 98.1 F (36.7 C) (Oral)   Resp 16   SpO2 100%   Physical Exam Vitals and nursing note reviewed. Exam conducted with a chaperone present.  Constitutional:      Appearance: Normal appearance.  HENT:     Head: Normocephalic and atraumatic.  Eyes:     General: No scleral icterus.       Right eye: No discharge.        Left eye: No discharge.     Extraocular Movements: Extraocular movements intact.     Pupils: Pupils are equal, round, and reactive to light.  Cardiovascular:     Rate and Rhythm: Normal rate and regular rhythm.     Pulses: Normal pulses.     Heart sounds: Normal heart sounds. No murmur heard. No friction rub. No gallop.   Pulmonary:     Effort: Pulmonary effort is normal. No respiratory distress.     Breath sounds: Normal breath sounds.  Abdominal:  General: Abdomen is flat. Bowel sounds are normal. There is no distension.     Palpations: Abdomen is soft.     Tenderness: There is abdominal tenderness in the epigastric area and left lower quadrant. There is no right CVA tenderness or left CVA tenderness.     Comments: Mild epigastric tenderness and left flank tenderness. No CVAT. No peritoneal signs.   Skin:    General: Skin is warm and dry.     Coloration: Skin is not jaundiced.  Neurological:     General: No focal deficit present.     Mental Status: He is alert. Mental status is at baseline.     Coordination: Coordination normal.     ED Results / Procedures / Treatments   Labs (all labs ordered are listed, but only abnormal results are  displayed) Labs Reviewed  LIPASE, BLOOD - Abnormal; Notable for the following components:      Result Value   Lipase 64 (*)    All other components within normal limits  COMPREHENSIVE METABOLIC PANEL - Abnormal; Notable for the following components:   Glucose, Bld 112 (*)    Creatinine, Ser 1.45 (*)    All other components within normal limits  URINALYSIS, ROUTINE W REFLEX MICROSCOPIC - Abnormal; Notable for the following components:   Color, Urine AMBER (*)    APPearance HAZY (*)    Specific Gravity, Urine 1.034 (*)    Ketones, ur 5 (*)    All other components within normal limits  CBC    EKG None  Radiology No results found.  Procedures Procedures   Medications Ordered in ED Medications - No data to display  ED Course  I have reviewed the triage vital signs and the nursing notes.  Pertinent labs & imaging results that were available during my care of the patient were reviewed by me and considered in my medical decision making (see chart for details).  Clinical Course as of 12/28/20 0906  Thu Dec 28, 2020  0808 Lipase(!): 64 Mildly elevated but 3x upper limit normal as would be expected with pancreatitis.  [HS]  0902 Urinalysis, Routine w reflex microscopic Urine, Clean Catch(!) No UTI [HS]  0902 Hemoglobin: 16.7 No anemia  [HS]  0902 Creatinine(!): 1.45 Chronically elevated Cr [HS]    Clinical Course User Index [HS] Theron Arista, PA-C   MDM Rules/Calculators/A&P                          Patient presents with flank pain and fatigue. Vitals stable, nontoxic appearing. PE reassuring. Suspect this could be UTI. Doubt kidney stone given exam and lack of significant flank pain. Doubt pyelo given no nausea, pain currently, or dysuria.   The fatigue and flank pain both appear to be more chronic concerns.   Elevated creatinine found on labs. Appears to be a chronic finding but no evidence of further workup. I've referred patient to a PCP for further evaluation. He  verbalizes understanding.   BUN  Date Value Ref Range Status  12/28/2020 16 6 - 20 mg/dL Final  72/03/4708 12 6 - 20 mg/dL Final  62/83/6629 11 6 - 20 mg/dL Final   Creatinine, Ser  Date Value Ref Range Status  12/28/2020 1.45 (H) 0.61 - 1.24 mg/dL Final  47/65/4650 3.54 (H) 0.61 - 1.24 mg/dL Final  65/68/1275 1.70 (H) 0.61 - 1.24 mg/dL Final   Fatigue is not explained by low potassium, evidence of b12 deficiency through macrocytic anemia on  CBC, or signs of GBS based on exam or history.   I walked the patient and  He is stable ambulatory. Discussed workup results with the patient. Although unable to find a cause, this appears to be a chronic issue that requires further evaluation on the outpatient. Return precautions given. Patient verbalizes understanding and agreement with the plan.   Discussed HPI, physical exam and plan of care for this patient with attending Rolan Bucco. The attending physician evaluated this patient as part of a shared visit and agrees with plan of care.  Final Clinical Impression(s) / ED Diagnoses Final diagnoses:  None    Rx / DC Orders ED Discharge Orders    None       Theron Arista, PA-C 12/28/20 0913    Rolan Bucco, MD 12/28/20 1017

## 2021-01-02 NOTE — Progress Notes (Deleted)
   CC: ***  HPI:  [name]. is a 35 y.o. {GC/GE:3044014::"man","woman"} with history as below who presents to clinic for ***. {GC/GE:3044014::"His","Her"} last clinic visit was on ***.   To see the details of this patient's management of their acute and chronic problems, please refer to the Assessment & Plan under the Encounters tab.    Past Medical History:  Diagnosis Date  . Scoliosis    Review of Systems:    ROS  Physical Exam:  There were no vitals filed for this visit. Constitutional: well-appearing {GC/GE:3044014::"man","woman"} sitting in chair, in no acute distress HENT: normocephalic atraumatic, mucous membranes moist Eyes: conjunctiva non-erythematous Neck: supple Cardiovascular: regular rate and rhythm, no m/r/g Pulmonary/Chest: normal work of breathing on room air, lungs clear to auscultation bilaterally Abdominal: soft, non-tender, non-distended MSK: normal bulk and tone Neurological: alert & oriented x 3, 5/5 strength in bilateral upper and lower extremities, normal gait Skin: warm and dry*** Psych: ***    Assessment & Plan:   See Encounters Tab for problem-based charting.  Patient {GC/GE:3044014::"discussed with","seen with"} Dr. {NAMES:3044014::"Dugue","Guilloud","Hoffman","Mullen","Narendra","Raines","Vincent"}

## 2021-01-03 ENCOUNTER — Encounter: Payer: PRIVATE HEALTH INSURANCE | Admitting: Student

## 2021-01-03 ENCOUNTER — Telehealth: Payer: Self-pay | Admitting: *Deleted

## 2021-01-03 NOTE — Telephone Encounter (Signed)
CALLED PATIENT UNABLE TO LEAVE MESSAGE / PATIENT "NO SHOWED" FOR THIS APPOINTMENT.  PATIENT NEEDS TO RESCHEDULE.

## 2021-08-24 IMAGING — CT CT ABD-PELV W/ CM
2 of 4 series · 15 of 46 positions shown, 17 images · IV contrast (APPLIED)
Comparison: None.

CLINICAL DATA: 33-year-old male with nausea abdominal pain.

EXAM:
CT ABDOMEN AND PELVIS WITH CONTRAST
TECHNIQUE: Multidetector CT imaging of the abdomen and pelvis was performed
using the standard protocol following bolus administration of
intravenous contrast.
CONTRAST:  80mL OMNIPAQUE IOHEXOL 300 MG/ML  SOLN

[Series 2: axial st · axial · 0.67mm/px · z∈[-639,-264]mm · 12 of 85 slices shown, 14 images]
[im 5/85  soft-tissue]
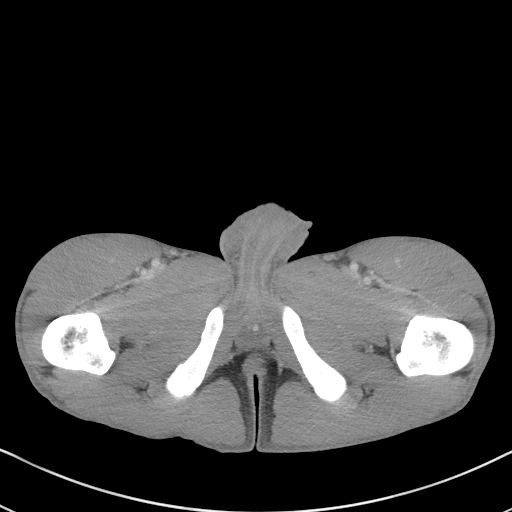
[im 5/85  bone]
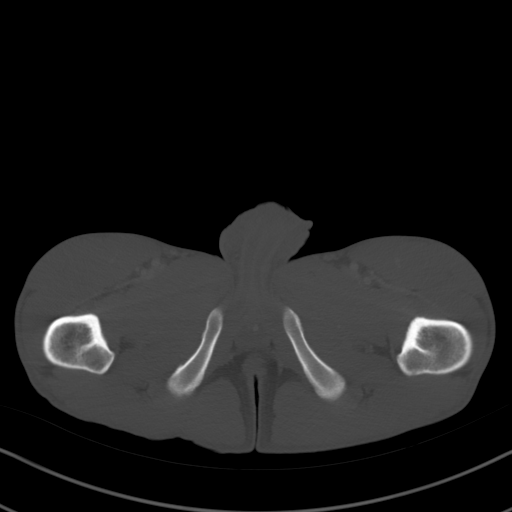
[im 14/85  soft-tissue]
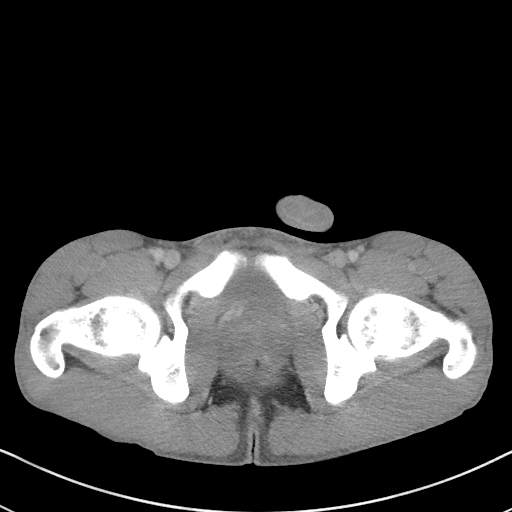
[im 18/85  soft-tissue]
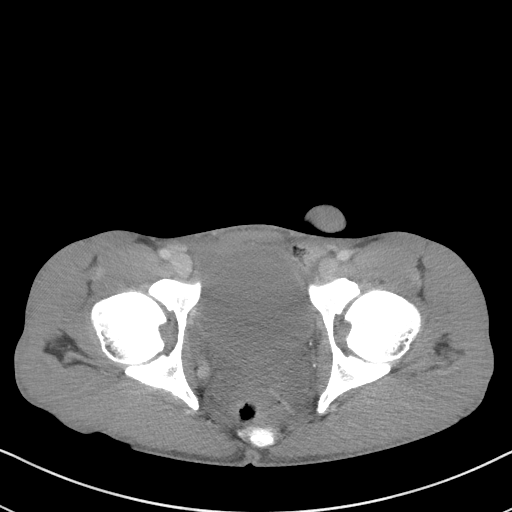
[im 27/85  soft-tissue]
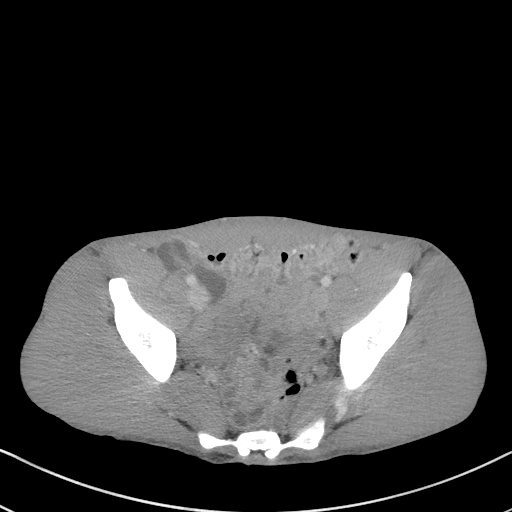
[im 31/85  soft-tissue]
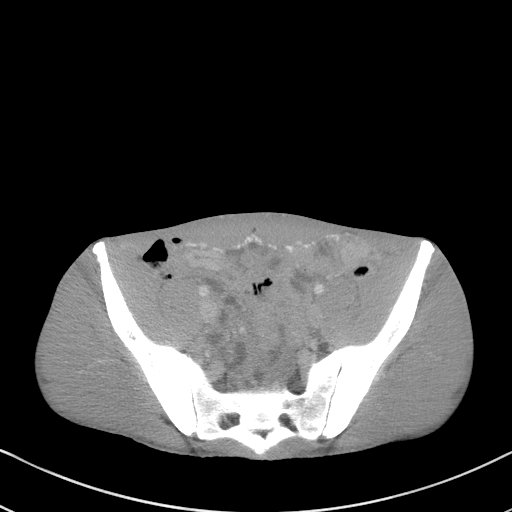
[im 40/85  soft-tissue]
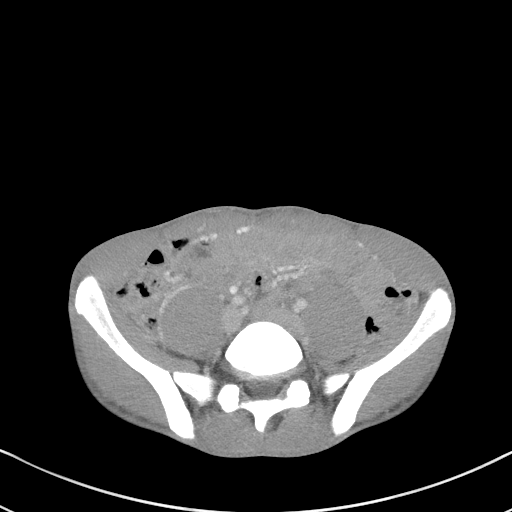
[im 45/85  soft-tissue]
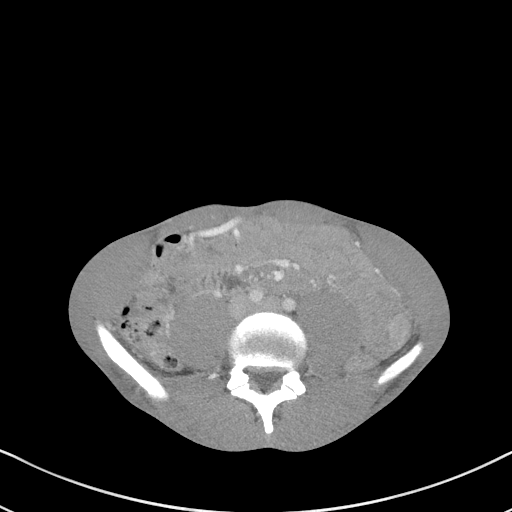
[im 54/85  soft-tissue]
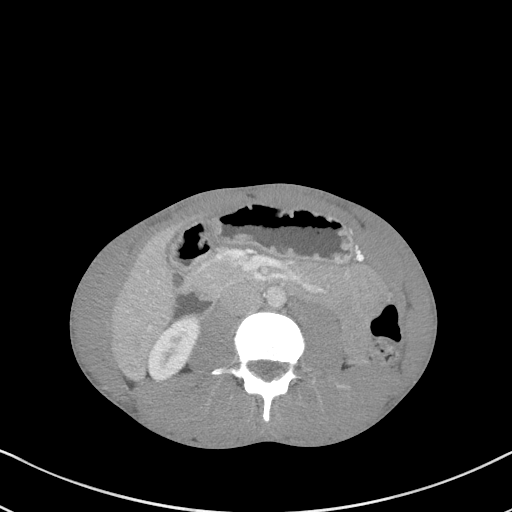
[im 58/85  soft-tissue]
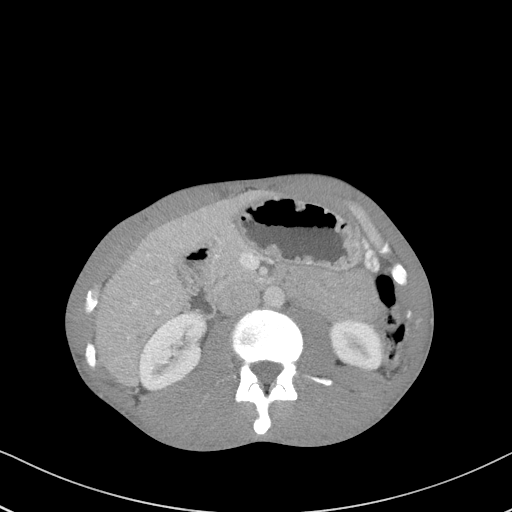
[im 58/85  bone]
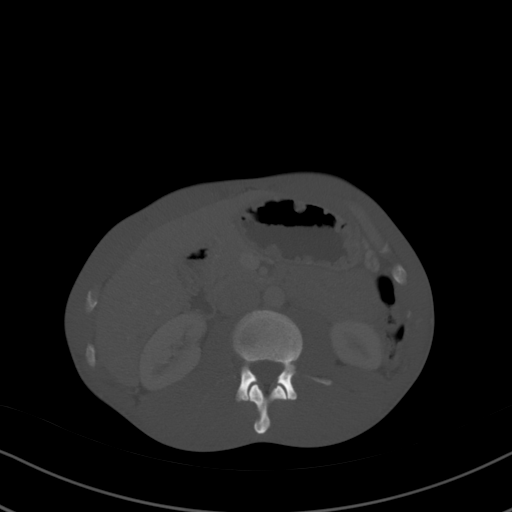
[im 67/85  soft-tissue]
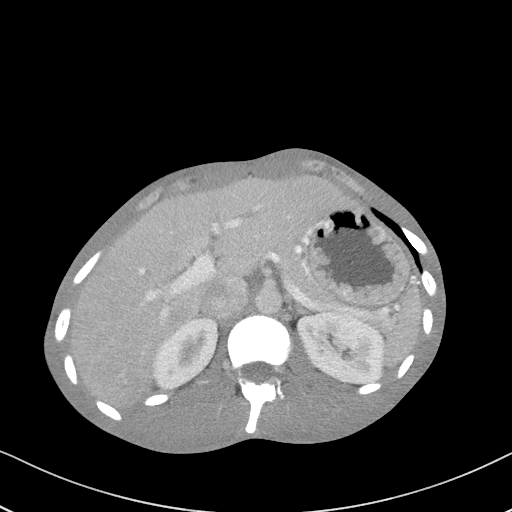
[im 71/85  soft-tissue]
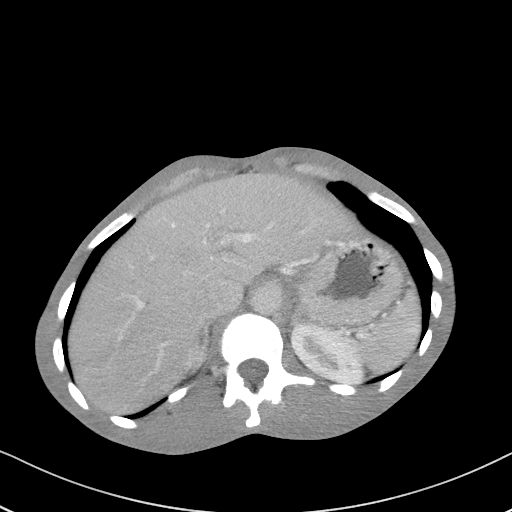
[im 80/85  soft-tissue]
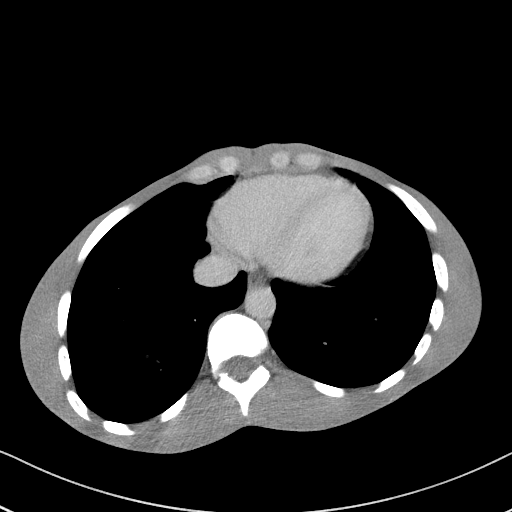

[Series 5: coronal st · coronal · 0.61mm/px · 3 of 73 slices shown]
[im 25/73  soft-tissue]
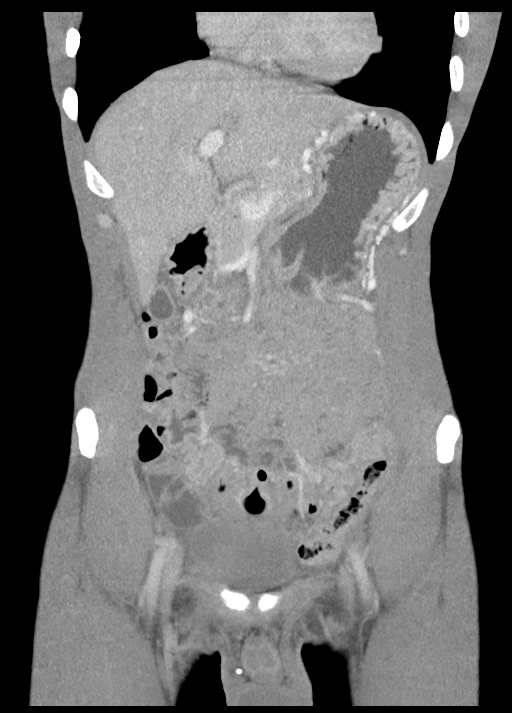
[im 33/73  soft-tissue]
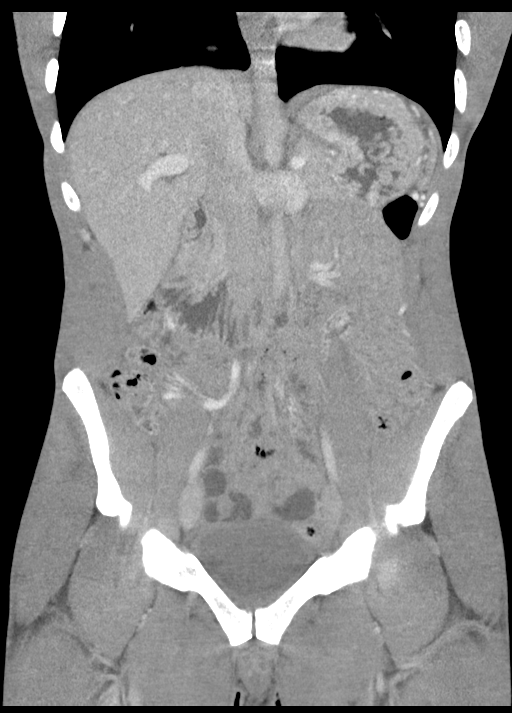
[im 41/73  soft-tissue]
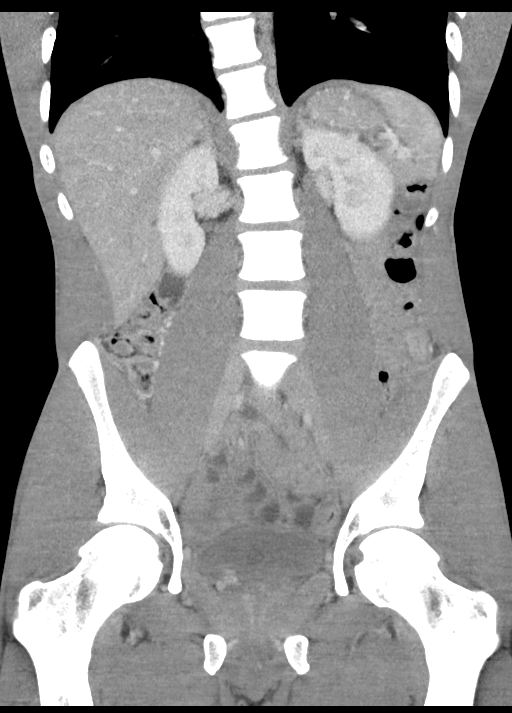

[15 of 46 positions shown; findings below may reference images not displayed]

FINDINGS: Evaluation is limited to severe cachexia and anasarca.

Lower chest: The visualized lung bases are clear.

No intra-abdominal free air. Small free fluid may be present within
the pelvis.

Hepatobiliary: No focal liver abnormality is seen. No gallstones,
gallbladder wall thickening, or biliary dilatation.

Pancreas: Unremarkable. No pancreatic ductal dilatation or
surrounding inflammatory changes.

Spleen: Normal in size without focal abnormality.

Adrenals/Urinary Tract: Adrenal glands are unremarkable. Kidneys are
normal, without renal calculi, focal lesion, or hydronephrosis.
Bladder is unremarkable.

Stomach/Bowel: There is no bowel obstruction. Mild thickened
appearance of the gastric rugal folds. Clinical correlation is
recommended to evaluate for gastritis. Mild diffuse thickened
appearance of the colonic wall may be related to underdistention or
represent colitis. The appendix is normal as visualized.

Vascular/Lymphatic: The abdominal aorta and IVC are grossly
unremarkable. No portal venous gas. No adenopathy.

Reproductive: The prostate and seminal vesicles are suboptimally
visualized.

Other: Loss of subcutaneous and mesenteric fat.

Musculoskeletal: No acute or significant osseous findings.
IMPRESSION: 1. Mild thickened appearance of the gastric rugal folds as well as
apparent mild diffuse thickening of the colon. Clinical correlation
is recommended to evaluate for possibility of gastritis or colitis.
No bowel obstruction. Normal appendix.
2. Loss of subcutaneous and mesenteric fat and cachexia.

## 2023-04-26 ENCOUNTER — Emergency Department (HOSPITAL_COMMUNITY): Payer: Self-pay

## 2023-04-26 ENCOUNTER — Other Ambulatory Visit: Payer: Self-pay

## 2023-04-26 ENCOUNTER — Emergency Department (HOSPITAL_COMMUNITY)
Admission: EM | Admit: 2023-04-26 | Discharge: 2023-04-26 | Disposition: A | Discharge status: Home / Self Care | Payer: Self-pay | Attending: Emergency Medicine | Admitting: Emergency Medicine

## 2023-04-26 DIAGNOSIS — R748 Abnormal levels of other serum enzymes: Secondary | ICD-10-CM | POA: Insufficient documentation

## 2023-04-26 DIAGNOSIS — R109 Unspecified abdominal pain: Secondary | ICD-10-CM | POA: Insufficient documentation

## 2023-04-26 LAB — URINALYSIS, ROUTINE W REFLEX MICROSCOPIC
Bacteria, UA: NONE SEEN
Bilirubin Urine: NEGATIVE
Glucose, UA: NEGATIVE mg/dL
Hgb urine dipstick: NEGATIVE
Ketones, ur: 20 mg/dL — AB
Leukocytes,Ua: NEGATIVE
Nitrite: NEGATIVE
Protein, ur: 30 mg/dL — AB
Specific Gravity, Urine: 1.028 (ref 1.005–1.030)
pH: 6 (ref 5.0–8.0)

## 2023-04-26 LAB — COMPREHENSIVE METABOLIC PANEL
ALT: 46 U/L — ABNORMAL HIGH (ref 0–44)
AST: 57 U/L — ABNORMAL HIGH (ref 15–41)
Albumin: 4.4 g/dL (ref 3.5–5.0)
Alkaline Phosphatase: 52 U/L (ref 38–126)
Anion gap: 11 (ref 5–15)
BUN: 13 mg/dL (ref 6–20)
CO2: 23 mmol/L (ref 22–32)
Calcium: 9.5 mg/dL (ref 8.9–10.3)
Chloride: 104 mmol/L (ref 98–111)
Creatinine, Ser: 1.38 mg/dL — ABNORMAL HIGH (ref 0.61–1.24)
GFR, Estimated: >60 mL/min (ref >60)
Glucose, Bld: 114 mg/dL — ABNORMAL HIGH (ref 70–99)
Potassium: 4.4 mmol/L (ref 3.5–5.1)
Sodium: 138 mmol/L (ref 135–145)
Total Bilirubin: 1.1 mg/dL (ref 0.3–1.2)
Total Protein: 7.4 g/dL (ref 6.5–8.1)

## 2023-04-26 LAB — CBC
HCT: 47.4 % (ref 39.0–52.0)
Hemoglobin: 15.7 g/dL (ref 13.0–17.0)
MCH: 32.6 pg (ref 26.0–34.0)
MCHC: 33.1 g/dL (ref 30.0–36.0)
MCV: 98.3 fL (ref 80.0–100.0)
Platelets: 240 10*3/uL (ref 150–400)
RBC: 4.82 MIL/uL (ref 4.22–5.81)
RDW: 12 % (ref 11.5–15.5)
WBC: 6.8 10*3/uL (ref 4.0–10.5)
nRBC: 0 % (ref 0.0–0.2)

## 2023-04-26 LAB — LIPASE, BLOOD: Lipase: 71 U/L — ABNORMAL HIGH (ref 11–51)

## 2023-04-26 MED ORDER — KETOROLAC TROMETHAMINE 15 MG/ML IJ SOLN
15.0000 mg | Freq: Once | INTRAMUSCULAR | Status: AC
  Administered 2023-04-26: 15 mg via INTRAMUSCULAR
  Filled 2023-04-26: qty 1

## 2023-04-26 MED ORDER — ONDANSETRON 4 MG PO TBDP
4.0000 mg | ORAL_TABLET | Freq: Once | ORAL | Status: AC
  Administered 2023-04-26: 4 mg via ORAL
  Filled 2023-04-26: qty 1

## 2023-04-26 MED ORDER — ONDANSETRON HCL 4 MG PO TABS: 4.0000 mg | ORAL_TABLET | Freq: Four times a day (QID) | ORAL | 0 refills | Status: AC | PRN

## 2023-04-26 NOTE — Discharge Instructions (Addendum)
This a pleasure taking part in your care today.  As we discussed, your workup today is reassuring.  Your CT scan did show possible constipation so I would like for you to trial MiraLAX at home or magnesium citrate.  There is are both over-the-counter and can be purchased at [employer].  Please follow-up with PCP which I am referring you to.  Please call and make an appointment to be seen. I am also sending you home with Zofran for nausea and vomiting; you may take this every 6 hours as needed.  Please read attached guide concerning left-sided flank pain. Please return to the ED with any new or worsening signs or symptoms.

## 2023-04-26 NOTE — ED Notes (Signed)
Patient transported to CT 

## 2023-04-26 NOTE — ED Triage Notes (Signed)
Patient reports L flank pain for flank pain, present for a few years, but worsening recently. Says he has "figured out" that he has kidney stones and has not passed them. Denies dysuria. Endorses emesis x 2 within the last hour.

## 2023-04-26 NOTE — ED Notes (Signed)
Patient discharged by this RN, teach back method used and patient verbalized understanding with no additional questions. Patient ambulatory to lobby at discharge. IV removed by this RN.

## 2023-04-26 NOTE — ED Provider Notes (Signed)
[facility] Provider Note   CSN: [id number] Arrival date & time: 04/26/23  1308     History  Chief Complaint  Patient presents with   Flank Pain    Eduardo Fields. is a 37 y.o. male with medical history of scoliosis.  The patient presents to the ED for evaluation of left flank pain.  The patient reports that he has had left flank pain for "years" but it has recently increased in pain in severity in the last 1 day.  He reports it feels as if he has kidney stones, he states that he has been told in the past she has kidney stones but has never passed any.  He states he is also having left testicle pain but states that this testicle pain is been ongoing for years now and the pain increased when his left flank pain increased.  Denies any concern for STI, penile discharge, dysuria, diarrhea.  Patient reports last bowel movement was this morning and was normal.  Endorses 2 episodes of nausea and vomiting this morning.   Flank Pain       Home Medications Prior to Admission medications   Medication Sig Start Date End Date Taking? Authorizing Provider  ondansetron (ZOFRAN) 4 MG tablet Take 1 tablet (4 mg total) by mouth every 6 (six) hours as needed for nausea or vomiting. 04/26/23  Yes Al Decant, PA-C      Allergies    Patient has no known allergies.    Review of Systems   Review of Systems  Constitutional:  Negative for fever.  Gastrointestinal:  Positive for nausea and vomiting. Negative for diarrhea.  Genitourinary:  Positive for flank pain and testicular pain. Negative for dysuria and penile discharge.  All other systems reviewed and are negative.   Physical Exam Updated Vital Signs BP 106/67   Pulse (!) 50   Temp 98.1 F (36.7 C) (Oral)   Resp 15   SpO2 100%  Physical Exam Vitals and nursing note reviewed. Exam conducted with a chaperone present.  Constitutional:      General: He is not in acute distress.     Appearance: Normal appearance. He is not ill-appearing, toxic-appearing or diaphoretic.  HENT:     Head: Normocephalic and atraumatic.     Nose: Nose normal.     Mouth/Throat:     Mouth: Mucous membranes are moist.     Pharynx: Oropharynx is clear.  Eyes:     Extraocular Movements: Extraocular movements intact.     Conjunctiva/sclera: Conjunctivae normal.     Pupils: Pupils are equal, round, and reactive to light.  Cardiovascular:     Rate and Rhythm: Normal rate and regular rhythm.  Pulmonary:     Effort: Pulmonary effort is normal.     Breath sounds: Normal breath sounds. No wheezing.  Abdominal:     General: Abdomen is flat. Bowel sounds are normal.     Palpations: Abdomen is soft.     Tenderness: There is no abdominal tenderness. There is no right CVA tenderness or left CVA tenderness.     Comments: No TTP of all 4 quadrants of abdomen, soft and compressible, no reaction elicited.  Negative CVA tenderness bilaterally.  Genitourinary:    Comments: Chaperone present.  No evidence of high riding testicle on left side.  No tenderness to left testicle.  Cremasteric reflex present. Musculoskeletal:     Cervical back: Normal range of motion and neck supple. No tenderness.  Skin:    General: Skin is warm and dry.     Capillary Refill: Capillary refill takes less than 2 seconds.  Neurological:     Mental Status: He is alert and oriented to person, place, and time.     ED Results / Procedures / Treatments   Labs (all labs ordered are listed, but only abnormal results are displayed) Labs Reviewed  COMPREHENSIVE METABOLIC PANEL - Abnormal; Notable for the following components:      Result Value   Glucose, Bld 114 (*)    Creatinine, Ser 1.38 (*)    AST 57 (*)    ALT 46 (*)    All other components within normal limits  URINALYSIS, ROUTINE W REFLEX MICROSCOPIC - Abnormal; Notable for the following components:   Ketones, ur 20 (*)    Protein, ur 30 (*)    All other components  within normal limits  LIPASE, BLOOD - Abnormal; Notable for the following components:   Lipase 71 (*)    All other components within normal limits  CBC    EKG None  Radiology US SCROTUM W/DOPPLER  Result Date: 04/26/2023 CLINICAL DATA:  Testicle pain EXAM: SCROTAL ULTRASOUND DOPPLER ULTRASOUND OF THE TESTICLES TECHNIQUE: Complete ultrasound examination of the testicles, epididymis, and other scrotal structures was performed. Color and spectral Doppler ultrasound were also utilized to evaluate blood flow to the testicles. COMPARISON:  None Available. FINDINGS: Right testicle Measurements: 4.4 x 2.1 x 3.0 cm. No mass or microlithiasis visualized. Left testicle Measurements: 4.3 x 2.6 x 2.4 cm. No mass or microlithiasis visualized. Right epididymis:  Normal in size and appearance. Left epididymis:  Normal in size and appearance. Hydrocele:  None visualized. Varicocele:  None visualized. Pulsed Doppler interrogation of both testes demonstrates normal low resistance arterial and venous waveforms bilaterally. IMPRESSION: Normal testicular ultrasound. No evidence for testicular torsion or mass. Electronically Signed   By: Signa Kell M.D.   On: 04/26/2023 10:24   CT Renal Stone Study  Result Date: 04/26/2023 CLINICAL DATA:  37 year old male with abdomen and flank pain. EXAM: CT ABDOMEN AND PELVIS WITHOUT CONTRAST TECHNIQUE: Multidetector CT imaging of the abdomen and pelvis was performed following the standard protocol without IV contrast. RADIATION DOSE REDUCTION: This exam was performed according to the departmental dose-optimization program which includes automated exposure control, adjustment of the mA and/or kV according to patient size and/or use of iterative reconstruction technique. COMPARISON:  CT Abdomen and Pelvis with contrast 10/21/2019. FINDINGS: Lower chest: Negative. Hepatobiliary: Negative noncontrast liver. Negative, diminutive gallbladder. Pancreas: Negative noncontrast appearance.  Spleen: Diminutive, negative. Adrenals/Urinary Tract: Adrenal glands appear normal. No hydronephrosis. Difficult to exclude punctate nephrolithiasis (coronal image 49 on the right). No evidence of pararenal inflammation. Proximal ureters seem decompressed. The ureters are difficult to delineate in the abdomen and pelvis due to a paucity of fat. Small left hemipelvis calcifications were present in 2021 compatible with phleboliths. Unremarkable noncontrast bladder. Stomach/Bowel: Large bowel retained stool and redundancy, including the transverse colon. But otherwise negative noncontrast appearance of the large bowel. The cecum is decompressed. Appendix is not delineated. Terminal ileum is decompressed. No dilated small bowel. Limited small bowel detail due to paucity of visceral fat. Small volume fluid in the stomach, and similar fluid in the proximal duodenum to the previous CT Abdomen and Pelvis. No free air or free fluid identified. Vascular/Lymphatic: Distal Calcified aortic atherosclerosis. Normal caliber abdominal aorta. Vascular patency is not evaluated in the absence of IV contrast. No lymphadenopathy is evident. Reproductive: Negative. Other:  No pelvis free fluid is evident. Musculoskeletal: Chronic thoracolumbar scoliosis, that visible is not significantly changed from 2021. IMPRESSION: 1. Noncontrast CT Abdomen and Pelvis is limited by a paucity of visceral fat. 2. Possible punctate nephrolithiasis but no evidence of obstructive uropathy. 3. Redundant large bowel. Appendix not identified. No acute or inflammatory process is identified. 4. Mild Calcified aortic atherosclerosis.  Thoracolumbar scoliosis. Electronically Signed   By: Odessa Fleming M.D.   On: 04/26/2023 08:58    Procedures Procedures   Medications Ordered in ED Medications  ondansetron (ZOFRAN-ODT) disintegrating tablet 4 mg (4 mg Oral Given 04/26/23 0740)  ketorolac (TORADOL) 15 MG/ML injection 15 mg (15 mg Intramuscular Given 04/26/23 1610)     ED Course/ Medical Decision Making/ A&P  Medical Decision Making Amount and/or Complexity of Data Reviewed Labs: ordered. Radiology: ordered.  Risk Prescription drug management.   37 year old man presents to the ED for evaluation.  Please see HPI for further details.  On examination patient is afebrile and nontachycardic.  Lung sounds are clear bilaterally, not hypoxic.  Abdomen soft and compressible throughout.  Negative CVA tenderness bilaterally.  Overall nontoxic in appearance.  Neurological examination baseline.  Patient CBC without leukocytosis or anemia.  Metabolic panel shows baseline creatinine 1.38, glucose 114, anion gap 11.  Derangement.  Patient urinalysis shows ketones and protein however negative leukocytes and nitrites.  Lipase elevated at 71 however patient lipase is typically elevated.  No epigastric tenderness.  Patient CT renal stone study shows large volume of stool however no ureterolithiasis to account for patient pain.  Ultrasound patient scrotum shows no evidence of torsion or other pathology.  At this time, doubt pyelo as well as UTI due to unremarkable urine sample.  Patient denies dysuria.  Question possible constipation despite the fact patient reports regular bowel movements.  Will have patient trial MiraLAX or magnesium citrate and follow-up with PCP.  Will refer patient to PCP.  Have given patient return precautions and he is voiced understanding.  All questions answered to the patient satisfaction.  He is stable to discharge at this time.   Final Clinical Impression(s) / ED Diagnoses Final diagnoses:  Left flank pain    Rx / DC Orders ED Discharge Orders          Ordered    ondansetron (ZOFRAN) 4 MG tablet  Every 6 hours PRN        04/26/23 1038              Al Decant, [place] 04/26/23 1039    Margarita Grizzle, MD 04/27/23 [phone]

## 2024-06-02 ENCOUNTER — Emergency Department (HOSPITAL_COMMUNITY)
Admission: EM | Admit: 2024-06-02 | Discharge: 2024-06-02 | Disposition: A | Discharge status: Home / Self Care | Payer: Self-pay

## 2024-06-02 ENCOUNTER — Other Ambulatory Visit: Payer: Self-pay

## 2024-06-02 ENCOUNTER — Encounter (HOSPITAL_COMMUNITY): Payer: Self-pay | Admitting: Emergency Medicine

## 2024-06-02 DIAGNOSIS — R1012 Left upper quadrant pain: Secondary | ICD-10-CM | POA: Insufficient documentation

## 2024-06-02 LAB — CBC
HCT: 44.5 % (ref 39.0–52.0)
Hemoglobin: 15 g/dL (ref 13.0–17.0)
MCH: 32.2 pg (ref 26.0–34.0)
MCHC: 33.7 g/dL (ref 30.0–36.0)
MCV: 95.5 fL (ref 80.0–100.0)
Platelets: 261 K/uL (ref 150–400)
RBC: 4.66 MIL/uL (ref 4.22–5.81)
RDW: 11.9 % (ref 11.5–15.5)
WBC: 6.7 K/uL (ref 4.0–10.5)
nRBC: 0 % (ref 0.0–0.2)

## 2024-06-02 LAB — HEPATIC FUNCTION PANEL
ALT: 17 U/L (ref 0–44)
AST: 22 U/L (ref 15–41)
Albumin: 4.2 g/dL (ref 3.5–5.0)
Alkaline Phosphatase: 51 U/L (ref 38–126)
Bilirubin, Direct: 0.2 mg/dL (ref 0.0–0.2)
Indirect Bilirubin: 1.5 mg/dL — ABNORMAL HIGH (ref 0.3–0.9)
Total Bilirubin: 1.7 mg/dL — ABNORMAL HIGH (ref 0.0–1.2)
Total Protein: 7 g/dL (ref 6.5–8.1)

## 2024-06-02 LAB — URINALYSIS, ROUTINE W REFLEX MICROSCOPIC
Bilirubin Urine: NEGATIVE
Glucose, UA: NEGATIVE mg/dL
Hgb urine dipstick: NEGATIVE
Ketones, ur: 20 mg/dL — AB
Leukocytes,Ua: NEGATIVE
Nitrite: NEGATIVE
Protein, ur: 30 mg/dL — AB
Specific Gravity, Urine: 1.04 — ABNORMAL HIGH (ref 1.005–1.030)
pH: 5 (ref 5.0–8.0)

## 2024-06-02 LAB — BASIC METABOLIC PANEL WITH GFR
Anion gap: 16 — ABNORMAL HIGH (ref 5–15)
BUN: 13 mg/dL (ref 6–20)
CO2: 23 mmol/L (ref 22–32)
Calcium: 9.2 mg/dL (ref 8.9–10.3)
Chloride: 98 mmol/L (ref 98–111)
Creatinine, Ser: 1.24 mg/dL (ref 0.61–1.24)
GFR, Estimated: >60 mL/min (ref >60)
Glucose, Bld: 89 mg/dL (ref 70–99)
Potassium: 3.9 mmol/L (ref 3.5–5.1)
Sodium: 137 mmol/L (ref 135–145)

## 2024-06-02 LAB — LIPASE, BLOOD: Lipase: 26 U/L (ref 11–51)

## 2024-06-02 MED ORDER — FAMOTIDINE 20 MG PO TABS
20.0000 mg | ORAL_TABLET | Freq: Two times a day (BID) | ORAL | 0 refills | Status: AC
Start: 2024-06-02 — End: ?

## 2024-06-02 MED ORDER — FAMOTIDINE 20 MG PO TABS
20.0000 mg | ORAL_TABLET | Freq: Once | ORAL | Status: AC
  Administered 2024-06-02: 20 mg via ORAL
  Filled 2024-06-02: qty 1

## 2024-06-02 MED ORDER — ALUM & MAG HYDROXIDE-SIMETH 200-200-20 MG/5ML PO SUSP
30.0000 mL | Freq: Once | ORAL | Status: AC
  Administered 2024-06-02: 30 mL via ORAL
  Filled 2024-06-02: qty 30

## 2024-06-02 MED ORDER — SODIUM CHLORIDE 0.9 % IV BOLUS
1000.0000 mL | Freq: Once | INTRAVENOUS | Status: AC
  Administered 2024-06-02: 1000 mL via INTRAVENOUS

## 2024-06-02 NOTE — ED Provider Notes (Addendum)
 [facility] Provider Note   CSN: [id number] Arrival date & time: 06/02/24  9344     Patient presents with: Flank Pain   Eduardo Fields. is a 38 y.o. male history of scoliosis presents with complaints of left sided abdominal pain x 3 days.  Reports that he initially had some nausea with emesis.  Has not thrown up since.  Pain is intermittent any obvious aggravating relieving factors.  Reports decreased p.o. intake.  Denies any prior abdominal surgeries.  No urinary symptoms.  No diarrhea.    Flank Pain      Past Medical History:  Diagnosis Date  . Scoliosis    History reviewed. No pertinent surgical history.   Prior to Admission medications   Medication Sig Start Date End Date Taking? Authorizing Provider  famotidine  (PEPCID ) 20 MG tablet Take 1 tablet (20 mg total) by mouth 2 (two) times daily. 06/02/24  Yes Donnajean Lynwood DEL, PA-C  ondansetron  (ZOFRAN ) 4 MG tablet Take 1 tablet (4 mg total) by mouth every 6 (six) hours as needed for nausea or vomiting. 04/26/23   Ruthell Lonni FALCON, PA-C    Allergies: Patient has no known allergies.    Review of Systems  Genitourinary:  Positive for flank pain.    Updated Vital Signs BP (!) 140/93   Pulse (!) 50   Temp 98.8 F (37.1 C) (Oral)   Resp 18   Ht 6' 1 (1.854 m)   Wt 65.8 kg   SpO2 100%   BMI 19.13 kg/m   Physical Exam Vitals and nursing note reviewed.  Constitutional:      General: He is not in acute distress.    Appearance: He is well-developed.  HENT:     Head: Normocephalic and atraumatic.  Eyes:     Conjunctiva/sclera: Conjunctivae normal.  Cardiovascular:     Rate and Rhythm: Normal rate and regular rhythm.     Heart sounds: No murmur heard. Pulmonary:     Effort: Pulmonary effort is normal. No respiratory distress.     Breath sounds: Normal breath sounds.  Abdominal:     Palpations: Abdomen is soft.     Tenderness: There is no abdominal tenderness.  There is no right CVA tenderness or left CVA tenderness.  Musculoskeletal:        General: No swelling.     Cervical back: Neck supple.  Skin:    General: Skin is warm and dry.     Capillary Refill: Capillary refill takes less than 2 seconds.  Neurological:     Mental Status: He is alert.  Psychiatric:        Mood and Affect: Mood normal.     (all labs ordered are listed, but only abnormal results are displayed) Labs Reviewed  URINALYSIS, ROUTINE W REFLEX MICROSCOPIC - Abnormal; Notable for the following components:      Result Value   Color, Urine AMBER (*)    Specific Gravity, Urine 1.040 (*)    Ketones, ur 20 (*)    Protein, ur 30 (*)    Bacteria, UA RARE (*)    All other components within normal limits  BASIC METABOLIC PANEL WITH GFR - Abnormal; Notable for the following components:   Anion gap 16 (*)    All other components within normal limits  HEPATIC FUNCTION PANEL - Abnormal; Notable for the following components:   Total Bilirubin 1.7 (*)    Indirect Bilirubin 1.5 (*)    All other components  within normal limits  CBC  LIPASE, BLOOD    EKG: None  Radiology: No results found.   Procedures   Medications Ordered in the ED  alum & mag hydroxide-simeth (MAALOX/MYLANTA) 200-200-20 MG/5ML suspension 30 mL (30 mLs Oral Given 06/02/24 0841)  famotidine  (PEPCID ) tablet 20 mg (20 mg Oral Given 06/02/24 0841)  sodium chloride  0.9 % bolus 1,000 mL (0 mLs Intravenous Stopped 06/02/24 0954)    Clinical Course as of 06/02/24 1000  Wed Jun 02, 2024  0738 Patient evaluated for abdominal pain for the past few days but was initially associate with some nausea and vomiting.  Upon arrival he is hemodynamically stable.  His abdomen is soft and nontender.  In triage his pain was described as flank pain.  However during my exam he localizes his pain to the left upper quadrant.  He has no CVAT.  Will begin with GI cocktail and routine labs. [JT]  0739 CBC Unremarkable [JT]  0740  Basic metabolic panel(!) Mild anion gap of 16, glucose and bicarb within normal limits [JT]  0744 Urinalysis, Routine w reflex microscopic -Urine, Clean Catch(!) Rare bacteria with negative leukocytes and negative nitrites, elevated specific gravity with 20 ketones present. [JT]  [id number] Hepatic function panel(!) Mildly elevated bilirubin [JT]  0947 Lipase, blood Within normal limits [JT]  0947 Workup overall reassuring.  There was some evidence of dehydration.  Patient rehydrated with IV fluids.  Patient reports marked improvement in symptoms.  He will be discharged home.  Will provide PCP follow-up.  Given that his pain was localized to the left upper quadrant and he had notable improvement with GI cocktail will send in trial for famotidine . [JT]    Clinical Course User Index [JT] Donnajean Lynwood DEL, PA-C                                 Medical Decision Making Amount and/or Complexity of Data Reviewed Labs: ordered. Decision-making details documented in ED Course.  Risk OTC drugs.   hospitalThis patient presents to the ED with chief complaint(s) of abdominal pain.  The complaint involves an extensive differential diagnosis and also carries with it a high risk of complications and morbidity.   Pertinent past medical history as listed in HPI  The differential diagnosis includes  Patient has no tenderness to McBurney's point is without leukocytosis do not suspect appendicitis.  Negative Murphy sign.  No lab work abnormality to suggest hepatobiliary disease.  He has no CVAT and no significant UA abnormality.  Do not suspect cystitis, pyelonephritis or nephrolithiasis.  Additional history obtained: Records reviewed Care Everywhere/External Records  Disposition:   Patient will be discharged home. The patient has been appropriately medically screened and/or stabilized in the ED. I have low suspicion for any other emergent medical condition which would require further screening, evaluation or  treatment in the ED or require inpatient management. At time of discharge the patient is hemodynamically stable and in no acute distress. I have discussed work-up results and diagnosis with patient and answered all questions. Patient is agreeable with discharge plan. We discussed strict return precautions for returning to the emergency department and they verbalized understanding.     Social Determinants of Health:   Patient's impaired access to primary care  increases the complexity of managing their presentation  This note was dictated with voice recognition software.  Despite best efforts at proofreading, errors may have occurred which can change the documentation  meaning.       Final diagnoses:  Left upper quadrant abdominal pain    ED Discharge Orders          Ordered    famotidine  (PEPCID ) 20 MG tablet  2 times daily        06/02/24 1000               [place], [place], [place] 06/02/24 0954    Donnajean Lynwood DEL, PA-C 06/02/24 1000    Neysa Caron PARAS, DO 06/02/24 1551

## 2024-06-02 NOTE — ED Triage Notes (Signed)
 Patient reports left side flank pain and decreased appetite for 3 days. Denies pain with urination but endorses dark urine.

## 2024-06-02 NOTE — ED Notes (Signed)
 Paper work reviewed with pt and iv removed. Pt is leaving in no new onset distress to be picked up by friend.

## 2024-06-02 NOTE — Discharge Instructions (Signed)
 You were evaluated in the emergency room for abdominal pain.  There was some evidence of dehydration on your lab work otherwise there was no other significant abnormality.  Please call the number listed to follow-up with your primary care doctor.  If you experience any worsening symptoms please return emergency room.
